# Patient Record
Sex: Female | Born: 1955 | Race: White | Hispanic: No | Marital: Married | Smoking: Former smoker
Health system: Southern US, Community
[De-identification: ages and names within clinical notes are randomized; demographics above are authoritative.]

## PROBLEM LIST (undated history)

## (undated) HISTORY — PX: BREAST ENHANCEMENT SURGERY: SHX7

## (undated) HISTORY — PX: TUBAL LIGATION: SHX77

## (undated) HISTORY — PX: OTHER SURGICAL HISTORY: SHX169

## (undated) HISTORY — PX: AUGMENTATION MAMMAPLASTY: SUR837

---

## 2016-10-15 ENCOUNTER — Ambulatory Visit: Payer: Self-pay | Admitting: Physician Assistant

## 2016-10-26 ENCOUNTER — Ambulatory Visit (INDEPENDENT_AMBULATORY_CARE_PROVIDER_SITE_OTHER): Payer: 59 | Admitting: Physician Assistant

## 2016-10-26 ENCOUNTER — Encounter: Payer: Self-pay | Admitting: Physician Assistant

## 2016-10-26 VITALS — BP 108/62 | HR 53 | Temp 97.7°F | Resp 16 | Ht 61.5 in | Wt 113.0 lb

## 2016-10-26 DIAGNOSIS — Z0000 Encounter for general adult medical examination without abnormal findings: Secondary | ICD-10-CM | POA: Diagnosis not present

## 2016-10-26 DIAGNOSIS — Z7689 Persons encountering health services in other specified circumstances: Secondary | ICD-10-CM

## 2016-10-26 NOTE — Progress Notes (Signed)
Patient ID: Jessica Duarte MRN: [id number], DOB: 12/04/1955, 61 y.o. Date of Encounter: 10/26/2016,   Chief Complaint: New Patient. Establish Care. Physical (CPE)  HPI: 61 y.o. y/o female here as a New Patient to Establish Care.   She moved here from [place], [place] in July 2017.  She went to her primary provider there for routine visits.  She had a complete physical exam there just before she moved.  Says that she had Pap smear and mammogram just before she moved here. States that she was had state insurance when she worked there but now has a Armed forces logistics/support/administrative officer. 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 Has a new insurance plan since moving here. Says that she wasn't 61 yet at the time of her visit there, so she did not get a shingles shot.  Otherwise, knows that all of her other preventive care is up-to-date and that the Shingles Vaccine is the only thing that would be indicated at this time. Says that she has had 2 colonoscopies. The first one revealed polyps so she had a repeat in 5 years. Says that the second one revealed no polyps. She has no family history of colon cancer.  Last colonoscopy was around October 2016.  Has no known chronic medical problems. She takes no medications.   Has no specific concerns to address today.  She is fine with [place] billing today's visit as a physical since this is a new insurance than the insurance she had at her last physical July 2017.  She knows that she just had breast exam, pelvic exam etc. so does not want to repeat these exams today.   Review of Systems: Consitutional: No fever, chills, fatigue, night sweats, lymphadenopathy. No significant/unexplained weight changes. Eyes: No visual changes, eye redness, or discharge. ENT/Mouth: No ear pain, sore throat, nasal drainage, or sinus pain. Cardiovascular: No chest pressure,heaviness, tightness or squeezing, even with exertion. No increased shortness of breath or dyspnea on  exertion.No palpitations, edema, orthopnea, PND. Respiratory: No cough, hemoptysis, SOB, or wheezing. Gastrointestinal: No anorexia, dysphagia, reflux, pain, nausea, vomiting, hematemesis, diarrhea, constipation, BRBPR, or melena. Breast: No mass, nodules, bulging, or retraction. No skin changes or inflammation. No nipple discharge. No lymphadenopathy. Genitourinary: No dysuria, hematuria, incontinence, vaginal discharge, pruritis, burning, abnormal bleeding, or pain. Musculoskeletal: No decreased ROM, No joint pain or swelling. No significant pain in neck, back, or extremities. Skin: No rash, pruritis, or concerning lesions. Neurological: No headache, dizziness, syncope, seizures, tremors, memory loss, coordination problems, or paresthesias. Psychological: No anxiety, depression, hallucinations, SI/HI. Endocrine: No polydipsia, polyphagia, polyuria, or known diabetes.No increased fatigue. No palpitations/rapid heart rate. No significant/unexplained weight change. All other systems were reviewed and are otherwise negative.  History reviewed. No pertinent past medical history.   Past Surgical History:  Procedure Laterality Date  . BREAST ENHANCEMENT SURGERY Bilateral    1990's  . TUBAL LIGATION    . vocal cord polyps      Home Meds:  No outpatient prescriptions prior to visit.   No facility-administered medications prior to visit.     Allergies: No Known Allergies  Social History   Social History  . Marital status: Married    Spouse name: N/A  . Number of children: N/A  . Years of education: N/A   Occupational History  . Not on file.   Social History Main Topics  . Smoking status: Former Smoker    Quit date: 10/27/2011  . Smokeless tobacco: Never Used  .  Alcohol use 0.6 oz/week    1 Glasses of wine per week  . Drug use: No  . Sexual activity: Yes   Other Topics Concern  . Not on file   Social History Narrative  . No narrative on file    Family History  Problem  Relation Age of Onset  . Arthritis Mother   . Hypertension Mother   . Kidney disease Mother   . Alcohol abuse Father   . Arthritis Father   . COPD Father   . Hearing loss Father   . Heart disease Father     Physical Exam: Blood pressure 108/62, pulse (!) 53, temperature 97.7 F (36.5 C), temperature source Oral, resp. rate 16, height 5' 1.5" (1.562 m), weight 113 lb (51.3 kg), SpO2 98 %., Body mass index is 21.01 kg/m. General: Well developed, well nourished WF. Appears in no acute distress. HEENT: Normocephalic, atraumatic. Conjunctiva pink, sclera non-icteric. Pupils 2 mm constricting to 1 mm, round, regular, and equally reactive to light and accomodation. EOMI. Internal auditory canal clear. TMs with good cone of light and without pathology. Nasal mucosa pink. Nares are without discharge. No sinus tenderness. Oral mucosa pink.  Pharynx without exudate.   Neck: Supple. Trachea midline. No thyromegaly. Full ROM. No lymphadenopathy.No Carotid Bruits. Lungs: Clear to auscultation bilaterally without wheezes, rales, or rhonchi. Breathing is of normal effort and unlabored. Cardiovascular: RRR with S1 S2. No murmurs, rubs, or gallops. Distal pulses 2+ symmetrically. No carotid or abdominal bruits. Breast: She defers. See HPI. Abdomen: Soft, non-tender, non-distended with normoactive bowel sounds. No hepatosplenomegaly or masses. No rebound/guarding. No CVA tenderness. No hernias.  Genitourinary: She defers. See HPI. Musculoskeletal: Full range of motion and 5/5 strength throughout.  Skin: Warm and moist without erythema, ecchymosis, wounds, or rash. Neuro: A+Ox3. CN II-XII grossly intact. Moves all extremities spontaneously. Full sensation throughout. Normal gait. Psych:  Responds to questions appropriately with a normal affect.   Assessment/Plan:  61 y.o. y/o female here for CPE  1. Encounter to establish care  2. Encounter for preventive health examination  A. Screening Labs: - CBC  with Differential/Platelet - COMPLETE METABOLIC PANEL WITH GFR - Lipid panel - TSH - VITAMIN D 25 Hydroxy (Vit-D Deficiency, Fractures)  B. Pap: She had Pap in [facility] July 2017--Reportedly negative  C. Screening Mammogram: Had mammogram in [place] in 2017. Discussed placing order for f/u mammogram but she wants to hold off. She will check her records regarding date of last mammogram. She will call when she needs [place] to place this order. Discussed that it is recommended to have mammogram annually. She voices understanding and agrees.  D. DEXA/BMD:  Can wait until age 80 to start these  E. Colorectal Cancer Screening: Says that she has had 2 colonoscopies. The first one revealed polyps so she had a repeat in 5 years. Says that the second one revealed no polyps. She has no family history of colon cancer.  Last colonoscopy was around October 2016. Discussed with her that if the last colonoscopy revealed no polyps and that she has no family history of colon cancer, she should be able to wait 10 years for next colonoscopy.  F. Immunizations:  Influenza: States that she never gets the flu vaccine Tetanus: States that she knows she had a tetanus vaccine within the past 5 years when she stepped on something. Does not know exact date. Pneumococcal: States that she has never had a pneumonia vaccine. She has no indication to need this until  age 55. Zostavax:  Initially she said that now that she is age 34, she wanted to get the shingles vaccine. I then discussed that we usually have patients check on cost with their insurance plan and she does want to check on this first. She is to call and get that information and then call and give [place] the information and decide whether she wants to proceed with this.  Signed, [address] [place], [place], [facility] 10/26/2016 10:13 AM

## 2016-10-27 LAB — LIPID PANEL
Cholesterol: 242 mg/dL — ABNORMAL HIGH (ref <200)
HDL: 85 mg/dL (ref >50)
LDL CALC: 140 mg/dL — AB (ref <100)
Total CHOL/HDL Ratio: 2.8 Ratio (ref <5.0)
Triglycerides: 84 mg/dL (ref <150)
VLDL: 17 mg/dL (ref <30)

## 2016-10-27 LAB — COMPLETE METABOLIC PANEL WITH GFR
ALT: 11 U/L (ref 6–29)
AST: 16 U/L (ref 10–35)
Albumin: 4.4 g/dL (ref 3.6–5.1)
Alkaline Phosphatase: 61 U/L (ref 33–130)
BUN: 14 mg/dL (ref 7–25)
CALCIUM: 9.9 mg/dL (ref 8.6–10.4)
CHLORIDE: 104 mmol/L (ref 98–110)
CO2: 24 mmol/L (ref 20–31)
Creat: 0.74 mg/dL (ref 0.50–0.99)
GFR, Est African American: >89 mL/min (ref >=60)
GFR, Est Non African American: 88 mL/min (ref >=60)
GLUCOSE: 91 mg/dL (ref 70–99)
Potassium: 4.9 mmol/L (ref 3.5–5.3)
SODIUM: 140 mmol/L (ref 135–146)
Total Bilirubin: 0.4 mg/dL (ref 0.2–1.2)
Total Protein: 7.1 g/dL (ref 6.1–8.1)

## 2016-10-27 LAB — CBC WITH DIFFERENTIAL/PLATELET
BASOS PCT: 1 %
Basophils Absolute: 53 cells/uL (ref 0–200)
Eosinophils Absolute: 106 cells/uL (ref 15–500)
Eosinophils Relative: 2 %
HEMATOCRIT: 42.5 % (ref 35.0–45.0)
Hemoglobin: 14.1 g/dL (ref 12.0–15.0)
LYMPHS ABS: 1908 {cells}/uL (ref 850–3900)
Lymphocytes Relative: 36 %
MCH: 32.3 pg (ref 27.0–33.0)
MCHC: 33.2 g/dL (ref 32.0–36.0)
MCV: 97.3 fL (ref 80.0–100.0)
MONO ABS: 583 {cells}/uL (ref 200–950)
MPV: 9.2 fL (ref 7.5–12.5)
Monocytes Relative: 11 %
Neutro Abs: 2650 cells/uL (ref 1500–7800)
Neutrophils Relative %: 50 %
Platelets: 286 10*3/uL (ref 140–400)
RBC: 4.37 MIL/uL (ref 3.80–5.10)
RDW: 13.1 % (ref 11.0–15.0)
WBC: 5.3 10*3/uL (ref 3.8–10.8)

## 2016-10-27 LAB — VITAMIN D 25 HYDROXY (VIT D DEFICIENCY, FRACTURES): VIT D 25 HYDROXY: 19 ng/mL — AB (ref 30–100)

## 2016-10-27 LAB — TSH: TSH: 0.97 m[IU]/L

## 2016-10-28 ENCOUNTER — Other Ambulatory Visit: Payer: Self-pay

## 2016-10-28 DIAGNOSIS — E559 Vitamin D deficiency, unspecified: Secondary | ICD-10-CM | POA: Insufficient documentation

## 2016-10-28 MED ORDER — VITAMIN D 50 MCG (2000 UT) PO TABS: 2000.0000 [IU] | ORAL_TABLET | Freq: Every day | ORAL | 3 refills | Status: AC

## 2017-04-27 ENCOUNTER — Other Ambulatory Visit: Payer: Self-pay | Admitting: Physician Assistant

## 2017-04-27 DIAGNOSIS — Z1231 Encounter for screening mammogram for malignant neoplasm of breast: Secondary | ICD-10-CM

## 2017-05-11 ENCOUNTER — Ambulatory Visit
Admission: RE | Admit: 2017-05-11 | Discharge: 2017-05-11 | Disposition: A | Discharge status: Home / Self Care | Payer: 59 | Source: Ambulatory Visit | Attending: Physician Assistant | Admitting: Physician Assistant

## 2017-05-11 DIAGNOSIS — Z1231 Encounter for screening mammogram for malignant neoplasm of breast: Secondary | ICD-10-CM

## 2017-05-18 ENCOUNTER — Ambulatory Visit (INDEPENDENT_AMBULATORY_CARE_PROVIDER_SITE_OTHER): Payer: 59 | Admitting: Family Medicine

## 2017-05-18 ENCOUNTER — Encounter: Payer: Self-pay | Admitting: Family Medicine

## 2017-05-18 VITALS — BP 144/68 | HR 68 | Temp 99.7°F | Wt 111.0 lb

## 2017-05-18 DIAGNOSIS — J329 Chronic sinusitis, unspecified: Secondary | ICD-10-CM

## 2017-05-18 DIAGNOSIS — G44209 Tension-type headache, unspecified, not intractable: Secondary | ICD-10-CM

## 2017-05-18 MED ORDER — DICLOFENAC SODIUM 75 MG PO TBEC: 75.0000 mg | DELAYED_RELEASE_TABLET | Freq: Two times a day (BID) | ORAL | 0 refills | Status: DC

## 2017-05-18 MED ORDER — AMOXICILLIN 875 MG PO TABS: 875.0000 mg | ORAL_TABLET | Freq: Two times a day (BID) | ORAL | 0 refills | Status: DC

## 2017-05-18 MED ORDER — TIZANIDINE HCL 4 MG PO TABS: 4.0000 mg | ORAL_TABLET | Freq: Four times a day (QID) | ORAL | 0 refills | Status: DC | PRN

## 2017-05-18 NOTE — Progress Notes (Signed)
Subjective:    Patient ID: Jessica Duarte, female    DOB: 1956-04-28, 61 y.o.   MRN: [id number]  HPI Patient presents with pain and pressure in her right maxillary sinus for 2-3 days.  She also reports postnasal drip and rhinorrhea.  Her husband has recently had the flu and a viral infection.  She denies any cough or body aches or fever however.  However her biggest concern is a headache that is.  The pain is located in her left occiput near the insertion of the trapezius.  It is made worse with range of motion in the neck.  It is tender to palpation near the insertion of the trapezius.  She denies any falls or injuries.  Pain is made worse with forward flexion of the neck.  However she shows no meningismus and is nontoxic.  She is tender to palpation on the occiput.  She denies any photophobia or phonophobia or neurologic deficit.  She denies any nausea or vomiting.  The headache is mild 3-4 on a scale of 1-10 but makes it uncomfortable for her to sleep at night No past medical history on file. Past Surgical History:  Procedure Laterality Date  . AUGMENTATION MAMMAPLASTY Bilateral   . BREAST ENHANCEMENT SURGERY Bilateral    1990's  . TUBAL LIGATION    . vocal cord polyps     Current Outpatient Prescriptions on File Prior to Visit  Medication Sig Dispense Refill  . Cholecalciferol (VITAMIN D) 2000 units tablet Take 1 tablet (2,000 Units total) by mouth daily. 30 tablet 3   No current facility-administered medications on file prior to visit.    No Known Allergies Social History   Social History  . Marital status: Married    Spouse name: N/A  . Number of children: N/A  . Years of education: N/A   Occupational History  . Not on file.   Social History Main Topics  . Smoking status: Former Smoker    Quit date: 10/27/2011  . Smokeless tobacco: Never Used  . Alcohol use 0.6 oz/week    1 Glasses of wine per week  . Drug use: No  . Sexual activity: Yes   Other Topics Concern  . Not on  file   Social History Narrative  . No narrative on file      Review of Systems  All other systems reviewed and are negative.      Objective:   Physical Exam  Constitutional: She is oriented to person, place, and time.  HENT:  Head: Normocephalic and atraumatic.    Right Ear: Tympanic membrane and ear canal normal.  Left Ear: Tympanic membrane and ear canal normal.  Nose: Mucosal edema and rhinorrhea present. Right sinus exhibits maxillary sinus tenderness.  Mouth/Throat: Oropharynx is clear and moist and mucous membranes are normal.  Neck: Neck supple. No JVD present.  Cardiovascular: Normal rate, regular rhythm and normal heart sounds.   Pulmonary/Chest: Effort normal and breath sounds normal. No respiratory distress. She has no wheezes. She has no rales.  Abdominal: Soft. Bowel sounds are normal. She exhibits no distension. There is no tenderness. There is no rebound.  Lymphadenopathy:    She has no cervical adenopathy.  Neurological: She is alert and oriented to person, place, and time. She has normal reflexes. She displays normal reflexes. No cranial nerve deficit. She exhibits normal muscle tone. Coordination normal.          Assessment & Plan:  Tension headache  Rhinosinusitis  I believe the  headache in her occiput is muscular and likely represents a strain in the trapezius muscle near its insertion on the occiput similar to a crick in the neck and I recommended Zanaflex 4 mg every 8 hours as needed for pain in the head/neck as well as diclofenac 75 mg p.o. twice daily as needed headache.  Reassess in 1 week or sooner if worse.  I would treat sinusitis with amoxicillin 875 mg p.o. twice daily for 10 days.

## 2018-05-16 ENCOUNTER — Other Ambulatory Visit: Payer: Self-pay | Admitting: Physician Assistant

## 2018-05-16 DIAGNOSIS — Z1231 Encounter for screening mammogram for malignant neoplasm of breast: Secondary | ICD-10-CM

## 2018-06-20 ENCOUNTER — Ambulatory Visit
Admission: RE | Admit: 2018-06-20 | Discharge: 2018-06-20 | Disposition: A | Discharge status: Home / Self Care | Payer: 59 | Source: Ambulatory Visit | Attending: Physician Assistant | Admitting: Physician Assistant

## 2018-06-20 DIAGNOSIS — Z1231 Encounter for screening mammogram for malignant neoplasm of breast: Secondary | ICD-10-CM

## 2018-06-29 ENCOUNTER — Ambulatory Visit (INDEPENDENT_AMBULATORY_CARE_PROVIDER_SITE_OTHER): Payer: 59 | Admitting: Family Medicine

## 2018-06-29 DIAGNOSIS — Z23 Encounter for immunization: Secondary | ICD-10-CM

## 2018-06-29 MED ORDER — ZOSTER VAC RECOMB ADJUVANTED 50 MCG/0.5ML IM SUSR: 0.5000 mL | Freq: Once | INTRAMUSCULAR | 1 refills | Status: AC

## 2019-08-28 ENCOUNTER — Ambulatory Visit (INDEPENDENT_AMBULATORY_CARE_PROVIDER_SITE_OTHER): Payer: 59 | Admitting: Family Medicine

## 2019-08-28 ENCOUNTER — Encounter: Payer: Self-pay | Admitting: Family Medicine

## 2019-08-28 ENCOUNTER — Other Ambulatory Visit: Payer: Self-pay

## 2019-08-28 VITALS — BP 118/74 | HR 50 | Temp 96.8°F | Resp 12 | Ht 61.5 in | Wt 106.0 lb

## 2019-08-28 DIAGNOSIS — Z0000 Encounter for general adult medical examination without abnormal findings: Secondary | ICD-10-CM | POA: Diagnosis not present

## 2019-08-28 DIAGNOSIS — Z23 Encounter for immunization: Secondary | ICD-10-CM

## 2019-08-28 DIAGNOSIS — E7800 Pure hypercholesterolemia, unspecified: Secondary | ICD-10-CM | POA: Diagnosis not present

## 2019-08-28 DIAGNOSIS — Z1231 Encounter for screening mammogram for malignant neoplasm of breast: Secondary | ICD-10-CM

## 2019-08-28 NOTE — Progress Notes (Signed)
Subjective:    Patient ID: Jessica Duarte, female    DOB: 1955-09-07, 64 y.o.   MRN: [id number]  HPI Patient is a very pleasant 64 year old white female who presents today for complete physical exam.  She has no concerns.  She has not had a Pap smear in over 4 years.  She is due for this today however she declines this.  She would like to reschedule this at her convenience.  She is overdue for mammogram.  She does request that I schedule this.  She had a bone density a few years ago that was normal.  She is due for this again when she turns 65.  She believes her last colonoscopy was performed in [place] in 2016.  She was told that this was completely normal.  Therefore she is not due for this again until 2026.  Her immunizations are up-to-date except for her flu shot and tetanus shot.  She request these today.  Otherwise she is doing well with no concerns. No past medical history on file.  Past Surgical History:  Procedure Laterality Date  . AUGMENTATION MAMMAPLASTY Bilateral   . BREAST ENHANCEMENT SURGERY Bilateral    1990's  . TUBAL LIGATION    . vocal cord polyps     Current Outpatient Medications on File Prior to Visit  Medication Sig Dispense Refill  . Cholecalciferol (VITAMIN D) 2000 units tablet Take 1 tablet (2,000 Units total) by mouth daily. 30 tablet 3   No current facility-administered medications on file prior to visit.   No Known Allergies Social History   Socioeconomic History  . Marital status: Married    Spouse name: Not on file  . Number of children: Not on file  . Years of education: Not on file  . Highest education level: Not on file  Occupational History  . Not on file  Tobacco Use  . Smoking status: Former Smoker    Quit date: 10/27/2011    Years since quitting: 7.8  . Smokeless tobacco: Never Used  Substance and Sexual Activity  . Alcohol use: Yes    Alcohol/week: 1.0 standard drinks    Types: 1 Glasses of wine per week  . Drug use: No  . Sexual activity:  Yes  Other Topics Concern  . Not on file  Social History Narrative  . Not on file   Social Determinants of Health   Financial Resource Strain:   . Difficulty of Paying Living Expenses: Not on file  Food Insecurity:   . Worried About Programme researcher, broadcasting/film/video in the Last Year: Not on file  . Ran Out of Food in the Last Year: Not on file  Transportation Needs:   . Lack of Transportation (Medical): Not on file  . Lack of Transportation (Non-Medical): Not on file  Physical Activity:   . Days of Exercise per Week: Not on file  . Minutes of Exercise per Session: Not on file  Stress:   . Feeling of Stress : Not on file  Social Connections:   . Frequency of Communication with Friends and Family: Not on file  . Frequency of Social Gatherings with Friends and Family: Not on file  . Attends Religious Services: Not on file  . Active Member of Clubs or Organizations: Not on file  . Attends Banker Meetings: Not on file  . Marital Status: Not on file  Intimate Partner Violence:   . Fear of Current or Ex-Partner: Not on file  . Emotionally Abused: Not  on file  . Physically Abused: Not on file  . Sexually Abused: Not on file   Family History  Problem Relation Age of Onset  . Arthritis Mother   . Hypertension Mother   . Kidney disease Mother   . Alcohol abuse Father   . Arthritis Father   . COPD Father   . Hearing loss Father   . Heart disease Father   . Breast cancer Neg Hx       Review of Systems  All other systems reviewed and are negative.      Objective:   Physical Exam Vitals reviewed.  Constitutional:      General: She is not in acute distress.    Appearance: Normal appearance. She is normal weight. She is not ill-appearing, toxic-appearing or diaphoretic.  HENT:     Head: Normocephalic and atraumatic.     Right Ear: Tympanic membrane, ear canal and external ear normal. There is no impacted cerumen.     Left Ear: Tympanic membrane, ear canal and external  ear normal. There is no impacted cerumen.     Nose: No congestion or rhinorrhea.     Mouth/Throat:     Mouth: Mucous membranes are moist.     Pharynx: No oropharyngeal exudate or posterior oropharyngeal erythema.  Eyes:     General: No scleral icterus.       Right eye: No discharge.        Left eye: No discharge.     Extraocular Movements: Extraocular movements intact.     Conjunctiva/sclera: Conjunctivae normal.     Pupils: Pupils are equal, round, and reactive to light.  Neck:     Vascular: No carotid bruit.  Cardiovascular:     Rate and Rhythm: Normal rate and regular rhythm.     Pulses: Normal pulses.     Heart sounds: Normal heart sounds. No murmur. No friction rub. No gallop.   Pulmonary:     Effort: Pulmonary effort is normal. No respiratory distress.     Breath sounds: Normal breath sounds. No stridor. No wheezing, rhonchi or rales.  Chest:     Chest wall: No tenderness.  Abdominal:     General: Abdomen is flat. Bowel sounds are normal. There is no distension.     Palpations: Abdomen is soft. There is no mass.     Tenderness: There is no abdominal tenderness. There is no right CVA tenderness, left CVA tenderness, guarding or rebound.     Hernia: No hernia is present.  Musculoskeletal:     Cervical back: Normal range of motion and neck supple. No rigidity or tenderness.     Right lower leg: No edema.     Left lower leg: No edema.  Lymphadenopathy:     Cervical: No cervical adenopathy.  Skin:    General: Skin is warm.     Coloration: Skin is not jaundiced or pale.     Findings: No bruising, erythema, lesion or rash.  Neurological:     General: No focal deficit present.     Mental Status: She is alert and oriented to person, place, and time. Mental status is at baseline.     Cranial Nerves: No cranial nerve deficit.     Sensory: No sensory deficit.     Motor: No weakness.     Coordination: Coordination normal.     Gait: Gait normal.     Deep Tendon Reflexes:  Reflexes normal.  Psychiatric:        Mood and Affect:  Mood normal.        Behavior: Behavior normal.        Thought Content: Thought content normal.        Judgment: Judgment normal.           Assessment & Plan:  Encounter for screening mammogram for malignant neoplasm of breast - Plan: MM Digital Screening  Pure hypercholesterolemia - Plan: CBC with Differential/Platelet, COMPLETE METABOLIC PANEL WITH GFR, Lipid panel  General medical exam   We discussed screening for hepatitis C and HIV.  The patient politely declines this.  I will schedule the patient for a mammogram.  She defers her Pap smear today and will reschedule this in her future.  I would recommend a bone density test at age 89.  Colonoscopy is not due again until 2026.  Patient will return for a CBC, CMP, fasting lipid panel.  Blood pressure is excellent.  Recommended she take 1200 mg a day of calcium and 1000 units a day of vitamin D.  Patient received a flu shot as well as a tetanus shot.

## 2019-08-28 NOTE — Addendum Note (Signed)
Addended by: Legrand Rams B on: 08/28/2019 12:16 PM   Modules accepted: Orders

## 2019-08-29 ENCOUNTER — Other Ambulatory Visit: Payer: 59

## 2019-08-30 LAB — LIPID PANEL
Cholesterol: 234 mg/dL — ABNORMAL HIGH (ref <200)
HDL: 84 mg/dL (ref >=50)
LDL Cholesterol (Calc): 129 mg/dL (calc) — ABNORMAL HIGH
Non-HDL Cholesterol (Calc): 150 mg/dL (calc) — ABNORMAL HIGH (ref <130)
Total CHOL/HDL Ratio: 2.8 (calc) (ref <5.0)
Triglycerides: 99 mg/dL (ref <150)

## 2019-08-30 LAB — CBC WITH DIFFERENTIAL/PLATELET
Absolute Monocytes: 420 cells/uL (ref 200–950)
Basophils Absolute: 38 cells/uL (ref 0–200)
Basophils Relative: 0.9 %
Eosinophils Absolute: 59 cells/uL (ref 15–500)
Eosinophils Relative: 1.4 %
HCT: 41.4 % (ref 35.0–45.0)
Hemoglobin: 14.1 g/dL (ref 11.7–15.5)
Lymphs Abs: 966 cells/uL (ref 850–3900)
MCH: 32.6 pg (ref 27.0–33.0)
MCHC: 34.1 g/dL (ref 32.0–36.0)
MCV: 95.6 fL (ref 80.0–100.0)
MPV: 9.4 fL (ref 7.5–12.5)
Monocytes Relative: 10 %
Neutro Abs: 2717 cells/uL (ref 1500–7800)
Neutrophils Relative %: 64.7 %
Platelets: 262 10*3/uL (ref 140–400)
RBC: 4.33 10*6/uL (ref 3.80–5.10)
RDW: 11.7 % (ref 11.0–15.0)
Total Lymphocyte: 23 %
WBC: 4.2 10*3/uL (ref 3.8–10.8)

## 2019-08-30 LAB — COMPLETE METABOLIC PANEL WITH GFR
AG Ratio: 2 (calc) (ref 1.0–2.5)
ALT: 15 U/L (ref 6–29)
AST: 19 U/L (ref 10–35)
Albumin: 4.4 g/dL (ref 3.6–5.1)
Alkaline phosphatase (APISO): 66 U/L (ref 37–153)
BUN: 17 mg/dL (ref 7–25)
CO2: 28 mmol/L (ref 20–32)
Calcium: 10 mg/dL (ref 8.6–10.4)
Chloride: 102 mmol/L (ref 98–110)
Creat: 0.81 mg/dL (ref 0.50–0.99)
GFR, Est African American: 90 mL/min/{1.73_m2} (ref >=60)
GFR, Est Non African American: 77 mL/min/{1.73_m2} (ref >=60)
Globulin: 2.2 g/dL (calc) (ref 1.9–3.7)
Glucose, Bld: 83 mg/dL (ref 65–99)
Potassium: 4.2 mmol/L (ref 3.5–5.3)
Sodium: 140 mmol/L (ref 135–146)
Total Bilirubin: 0.6 mg/dL (ref 0.2–1.2)
Total Protein: 6.6 g/dL (ref 6.1–8.1)

## 2019-10-09 ENCOUNTER — Ambulatory Visit
Admission: RE | Admit: 2019-10-09 | Discharge: 2019-10-09 | Disposition: A | Discharge status: Home / Self Care | Payer: 59 | Source: Ambulatory Visit | Attending: Family Medicine | Admitting: Family Medicine

## 2019-10-09 ENCOUNTER — Other Ambulatory Visit: Payer: Self-pay

## 2019-10-09 DIAGNOSIS — Z1231 Encounter for screening mammogram for malignant neoplasm of breast: Secondary | ICD-10-CM

## 2021-02-04 ENCOUNTER — Other Ambulatory Visit: Payer: Self-pay

## 2021-02-04 ENCOUNTER — Encounter: Payer: Self-pay | Admitting: Family Medicine

## 2021-02-04 ENCOUNTER — Ambulatory Visit (INDEPENDENT_AMBULATORY_CARE_PROVIDER_SITE_OTHER): Payer: Medicare Other | Admitting: Family Medicine

## 2021-02-04 VITALS — BP 122/74 | HR 67 | Temp 97.9°F | Ht 61.5 in | Wt 105.8 lb

## 2021-02-04 DIAGNOSIS — Z0001 Encounter for general adult medical examination with abnormal findings: Secondary | ICD-10-CM

## 2021-02-04 DIAGNOSIS — Z23 Encounter for immunization: Secondary | ICD-10-CM

## 2021-02-04 DIAGNOSIS — Z13220 Encounter for screening for lipoid disorders: Secondary | ICD-10-CM

## 2021-02-04 DIAGNOSIS — Z780 Asymptomatic menopausal state: Secondary | ICD-10-CM

## 2021-02-04 DIAGNOSIS — R5383 Other fatigue: Secondary | ICD-10-CM

## 2021-02-04 DIAGNOSIS — Z1231 Encounter for screening mammogram for malignant neoplasm of breast: Secondary | ICD-10-CM | POA: Diagnosis not present

## 2021-02-04 DIAGNOSIS — Z0000 Encounter for general adult medical examination without abnormal findings: Secondary | ICD-10-CM

## 2021-02-04 NOTE — Progress Notes (Signed)
Subjective:    Patient ID: Jessica Duarte, female    DOB: 11/06/1955, 65 y.o.   MRN: [id number]  HPI Patient is a 65 year old Caucasian female here today for complete physical exam.  Patient is due for a Pap smear but she declines this.  She reluctantly agrees to a mammogram as well as a bone density test.  Her colonoscopy is up-to-date.  Her last colonoscopy was in 6 years ago in [place].  She is not due for another colonoscopy for 4 more years.  She is due for Pneumovax 23.  She is also due for shingles vaccine. History reviewed. No pertinent past medical history.  Past Surgical History:  Procedure Laterality Date   AUGMENTATION MAMMAPLASTY Bilateral    BREAST ENHANCEMENT SURGERY Bilateral    1990's   TUBAL LIGATION     vocal cord polyps     Current Outpatient Medications on File Prior to Visit  Medication Sig Dispense Refill   Cholecalciferol (VITAMIN D) 2000 units tablet Take 1 tablet (2,000 Units total) by mouth daily. (Patient not taking: Reported on 02/04/2021) 30 tablet 3   No current facility-administered medications on file prior to visit.   No Known Allergies Social History   Socioeconomic History   Marital status: Married    Spouse name: Not on file   Number of children: Not on file   Years of education: Not on file   Highest education level: Not on file  Occupational History   Not on file  Tobacco Use   Smoking status: Former    Pack years: 0.00    Types: Cigarettes    Quit date: 10/27/2011    Years since quitting: 9.2   Smokeless tobacco: Never  Substance and Sexual Activity   Alcohol use: Yes    Alcohol/week: 1.0 standard drink    Types: 1 Glasses of wine per week   Drug use: No   Sexual activity: Yes  Other Topics Concern   Not on file  Social History Narrative   Not on file   Social Determinants of Health   Financial Resource Strain: Not on file  Food Insecurity: Not on file  Transportation Needs: Not on file  Physical Activity: Not on file   Stress: Not on file  Social Connections: Not on file  Intimate Partner Violence: Not on file   Family History  Problem Relation Age of Onset   Arthritis Mother    Hypertension Mother    Kidney disease Mother    Alcohol abuse Father    Arthritis Father    COPD Father    Hearing loss Father    Heart disease Father    Breast cancer Neg Hx       Review of Systems  All other systems reviewed and are negative.     Objective:   Physical Exam Vitals reviewed.  Constitutional:      General: She is not in acute distress.    Appearance: Normal appearance. She is normal weight. She is not ill-appearing, toxic-appearing or diaphoretic.  HENT:     Head: Normocephalic and atraumatic.     Right Ear: Tympanic membrane, ear canal and external ear normal. There is no impacted cerumen.     Left Ear: Tympanic membrane, ear canal and external ear normal. There is no impacted cerumen.     Nose: No congestion or rhinorrhea.     Mouth/Throat:     Mouth: Mucous membranes are moist.     Pharynx: No oropharyngeal exudate or  posterior oropharyngeal erythema.  Eyes:     General: No scleral icterus.       Right eye: No discharge.        Left eye: No discharge.     Extraocular Movements: Extraocular movements intact.     Conjunctiva/sclera: Conjunctivae normal.     Pupils: Pupils are equal, round, and reactive to light.  Neck:     Vascular: No carotid bruit.  Cardiovascular:     Rate and Rhythm: Normal rate and regular rhythm.     Pulses: Normal pulses.     Heart sounds: Normal heart sounds. No murmur heard.   No friction rub. No gallop.  Pulmonary:     Effort: Pulmonary effort is normal. No respiratory distress.     Breath sounds: Normal breath sounds. No stridor. No wheezing, rhonchi or rales.  Chest:     Chest wall: No tenderness.  Abdominal:     General: Abdomen is flat. Bowel sounds are normal. There is no distension.     Palpations: Abdomen is soft. There is no mass.      Tenderness: There is no abdominal tenderness. There is no right CVA tenderness, left CVA tenderness, guarding or rebound.     Hernia: No hernia is present.  Musculoskeletal:     Cervical back: Normal range of motion and neck supple. No rigidity or tenderness.     Right lower leg: No edema.     Left lower leg: No edema.  Lymphadenopathy:     Cervical: No cervical adenopathy.  Skin:    General: Skin is warm.     Coloration: Skin is not jaundiced or pale.     Findings: No bruising, erythema, lesion or rash.  Neurological:     General: No focal deficit present.     Mental Status: She is alert and oriented to person, place, and time. Mental status is at baseline.     Cranial Nerves: No cranial nerve deficit.     Sensory: No sensory deficit.     Motor: No weakness.     Coordination: Coordination normal.     Gait: Gait normal.     Deep Tendon Reflexes: Reflexes normal.  Psychiatric:        Mood and Affect: Mood normal.        Behavior: Behavior normal.        Thought Content: Thought content normal.        Judgment: Judgment normal.          Assessment & Plan:  Postmenopausal estrogen deficiency - Plan: DG Bone Density  Need for pneumococcal vaccination - Plan: Pneumococcal polysaccharide vaccine 23-valent greater than or equal to 2yo subcutaneous/IM  Encounter for screening mammogram for malignant neoplasm of breast - Plan: MM Digital Screening  General medical exam - Plan: MM Digital Screening Return fasting for a CBC CMP and a fasting lipid panel.  Schedule patient for a bone density.  Schedule the patient for mammogram.  She received Pneumovax 23 today.  Recommended the shingles vaccine.  Patient declines a Pap smear.  Colonoscopy is up-to-date.

## 2021-02-05 ENCOUNTER — Encounter: Payer: Self-pay | Admitting: Family Medicine

## 2021-02-06 ENCOUNTER — Other Ambulatory Visit: Payer: Self-pay | Admitting: Family Medicine

## 2021-02-06 DIAGNOSIS — Z87891 Personal history of nicotine dependence: Secondary | ICD-10-CM

## 2021-02-06 DIAGNOSIS — Z122 Encounter for screening for malignant neoplasm of respiratory organs: Secondary | ICD-10-CM

## 2021-02-07 ENCOUNTER — Other Ambulatory Visit: Payer: Medicare Other

## 2021-02-07 ENCOUNTER — Other Ambulatory Visit: Payer: Self-pay

## 2021-02-07 LAB — COMPLETE METABOLIC PANEL WITH GFR
AG Ratio: 2 (calc) (ref 1.0–2.5)
ALT: 11 U/L (ref 6–29)
AST: 16 U/L (ref 10–35)
Albumin: 4.2 g/dL (ref 3.6–5.1)
Alkaline phosphatase (APISO): 62 U/L (ref 37–153)
BUN: 13 mg/dL (ref 7–25)
CO2: 30 mmol/L (ref 20–32)
Calcium: 10 mg/dL (ref 8.6–10.4)
Chloride: 103 mmol/L (ref 98–110)
Creat: 0.82 mg/dL (ref 0.50–1.05)
Globulin: 2.1 g/dL (calc) (ref 1.9–3.7)
Glucose, Bld: 81 mg/dL (ref 65–99)
Potassium: 4.9 mmol/L (ref 3.5–5.3)
Sodium: 141 mmol/L (ref 135–146)
Total Bilirubin: 0.4 mg/dL (ref 0.2–1.2)
Total Protein: 6.3 g/dL (ref 6.1–8.1)
eGFR: 79 mL/min/{1.73_m2} (ref >=60)

## 2021-02-07 LAB — CBC WITH DIFFERENTIAL/PLATELET
Absolute Monocytes: 632 cells/uL (ref 200–950)
Basophils Absolute: 88 cells/uL (ref 0–200)
Basophils Relative: 1.8 %
Eosinophils Absolute: 152 cells/uL (ref 15–500)
Eosinophils Relative: 3.1 %
HCT: 42.5 % (ref 35.0–45.0)
Hemoglobin: 14.2 g/dL (ref 11.7–15.5)
Lymphs Abs: 1759 cells/uL (ref 850–3900)
MCH: 31.8 pg (ref 27.0–33.0)
MCHC: 33.4 g/dL (ref 32.0–36.0)
MCV: 95.1 fL (ref 80.0–100.0)
MPV: 9.3 fL (ref 7.5–12.5)
Monocytes Relative: 12.9 %
Neutro Abs: 2269 cells/uL (ref 1500–7800)
Neutrophils Relative %: 46.3 %
Platelets: 334 10*3/uL (ref 140–400)
RBC: 4.47 10*6/uL (ref 3.80–5.10)
RDW: 11.8 % (ref 11.0–15.0)
Total Lymphocyte: 35.9 %
WBC: 4.9 10*3/uL (ref 3.8–10.8)

## 2021-02-07 LAB — LIPID PANEL
Cholesterol: 216 mg/dL — ABNORMAL HIGH (ref <200)
HDL: 70 mg/dL (ref >=50)
LDL Cholesterol (Calc): 125 mg/dL (calc) — ABNORMAL HIGH
Non-HDL Cholesterol (Calc): 146 mg/dL (calc) — ABNORMAL HIGH (ref <130)
Total CHOL/HDL Ratio: 3.1 (calc) (ref <5.0)
Triglycerides: 107 mg/dL (ref <150)

## 2021-02-17 ENCOUNTER — Other Ambulatory Visit: Payer: Self-pay | Admitting: *Deleted

## 2021-02-17 DIAGNOSIS — Z87891 Personal history of nicotine dependence: Secondary | ICD-10-CM

## 2021-03-17 ENCOUNTER — Ambulatory Visit
Admission: RE | Admit: 2021-03-17 | Discharge: 2021-03-17 | Disposition: A | Discharge status: Home / Self Care | Payer: Medicare Other | Source: Ambulatory Visit | Attending: Acute Care | Admitting: Acute Care

## 2021-03-17 ENCOUNTER — Encounter: Payer: Self-pay | Admitting: Acute Care

## 2021-03-17 ENCOUNTER — Ambulatory Visit (INDEPENDENT_AMBULATORY_CARE_PROVIDER_SITE_OTHER): Payer: Medicare Other | Admitting: Acute Care

## 2021-03-17 DIAGNOSIS — Z87891 Personal history of nicotine dependence: Secondary | ICD-10-CM

## 2021-03-17 DIAGNOSIS — Z122 Encounter for screening for malignant neoplasm of respiratory organs: Secondary | ICD-10-CM

## 2021-03-17 NOTE — Patient Instructions (Signed)
Thank you for participating in the [place] [facility]. It was our pleasure to meet you today. We will call you with the results of your scan within the next few days. Your scan will be assigned a Lung RADS category score by the physicians reading the scans.  This Lung RADS score determines follow up scanning.  See below for description of categories, and follow up screening recommendations. We will be in touch to schedule your follow up screening annually or based on recommendations of our providers. We will fax a copy of your scan results to your Primary Care Physician, or the physician who referred you to the program, to ensure they have the results. Please call the office if you have any questions or concerns regarding your scanning experience or results.  Our office number is [phone]. Please speak with Denise Phelps, RN. She is our Lung Cancer Screening RN. If she is unavailable when you call, please have the office staff send her a message. She will return your call at her earliest convenience. Remember, if your scan is normal, we will scan you annually as long as you continue to meet the criteria for the program. (Age 55-77, Current smoker or smoker who has quit within the last 15 years). If you are a smoker, remember, quitting is the single most powerful action that you can take to decrease your risk of lung cancer and other pulmonary, breathing related problems. We know quitting is hard, and we are here to help.  Please let us know if there is anything we can do to help you meet your goal of quitting. If you are a former smoker, congratulations. We are proud of you! Remain smoke free! Remember you can refer friends or family members through the number above.  We will screen them to make sure they meet criteria for the program. Thank you for helping us take better care of you by participating in Lung Screening.  Lung RADS Categories:  Lung RADS 1: no nodules  or definitely non-concerning nodules.  Recommendation is for a repeat annual scan in 12 months.  Lung RADS 2:  nodules that are non-concerning in appearance and behavior with a very low likelihood of becoming an active cancer. Recommendation is for a repeat annual scan in 12 months.  Lung RADS 3: nodules that are probably non-concerning , includes nodules with a low likelihood of becoming an active cancer.  Recommendation is for a 6-month repeat screening scan. Often noted after an upper respiratory illness. We will be in touch to make sure you have no questions, and to schedule your 6-month scan.  Lung RADS 4 A: nodules with concerning findings, recommendation is most often for a follow up scan in 3 months or additional testing based on our provider's assessment of the scan. We will be in touch to make sure you have no questions and to schedule the recommended 3 month follow up scan.  Lung RADS 4 B:  indicates findings that are concerning. We will be in touch with you to schedule additional diagnostic testing based on our provider's  assessment of the scan.   

## 2021-03-17 NOTE — Progress Notes (Signed)
Virtual Visit via Video Note  I connected with Jessica Duarte on 03/17/21 at  9:30 AM EDT by a video enabled telemedicine application and verified that I am speaking with the correct person using two identifiers.  Location: Patient: At home Provider: [address] [place], [place], [address]    I discussed the limitations of evaluation and management by telemedicine and the availability of in person appointments. The patient expressed understanding and agreed to proceed.  Shared Decision Making Visit Lung Cancer Screening Program [phone])   Eligibility: Age 65 y.o. Pack Years Smoking History Calculation 51 pack year smoking history (# packs/per year x # years smoked) Recent History of coughing up blood  no Unexplained weight loss? no ( >Than 15 pounds within the last 6 months ) Prior History Lung / other cancer no (Diagnosis within the last 5 years already requiring surveillance chest CT Scans). Smoking Status Former Smoker Former Smokers: Years since quit: 10 years  Quit Date: 2012  Visit Components: Discussion included one or more decision making aids. yes Discussion included risk/benefits of screening. yes Discussion included potential follow up diagnostic testing for abnormal scans. yes Discussion included meaning and risk of over diagnosis. yes Discussion included meaning and risk of False Positives. yes Discussion included meaning of total radiation exposure. yes  Counseling Included: Importance of adherence to annual lung cancer LDCT screening. yes Impact of comorbidities on ability to participate in the program. yes Ability and willingness to under diagnostic treatment. yes  Smoking Cessation Counseling: Current Smokers:  Discussed importance of smoking cessation. yes Information about tobacco cessation classes and interventions provided to patient. yes Patient provided with "ticket" for LDCT Scan. yes Symptomatic Patient. no  Counseling Diagnosis Code:  Tobacco Use Z72.0 Asymptomatic Patient yes  Counseling (Intermediate counseling: > three minutes counseling) [id number] Former Smokers:  Discussed the importance of maintaining cigarette abstinence. yes Diagnosis Code: Personal History of Nicotine Dependence. W29.562 Information about tobacco cessation classes and interventions provided to patient. Yes Patient provided with "ticket" for LDCT Scan. yes Written Order for Lung Cancer Screening with LDCT placed in Epic. Yes (CT Chest Lung Cancer Screening Low Dose W/O CM) [id number] Z12.2-Screening of respiratory organs Z87.891-Personal history of nicotine dependence  I spent 25 minutes of face to face time with Jessica Duarte discussing the risks and benefits of lung cancer screening. We viewed a power point together that explained in detail the above noted topics. We took the time to pause the power point at intervals to allow for questions to be asked and answered to ensure understanding. We discussed that she had taken the single most powerful action possible to decrease her risk of developing lung cancer when she quit smoking. I counseled her to remain smoke free, and to contact me if she ever had the desire to smoke again so that I can provide resources and tools to help support the effort to remain smoke free. We discussed the time and location of the scan, and that either  Jessica Miyamoto RN or I will call with the results within  24-48 hours of receiving them. She has my card and contact information in the event she needs to speak with me, in addition to a copy of the power point we reviewed as a resource. She verbalized understanding of all of the above and had no further questions upon leaving the office.     I explained to the patient that there has been a high incidence of coronary artery disease noted on these  exams. I explained that this is a non-gated exam therefore degree or severity cannot be determined. This patient is not on statin therapy. I have  asked the patient to follow-up with their PCP regarding any incidental finding of coronary artery disease and management with diet or medication as they feel is clinically indicated. The patient verbalized understanding of the above and had no further questions.  Pt does still on occasion use nicotine gum or mints. We will send her a stronger than your excuses card for free nicotine replacement therapy.      Bevelyn Ngo, NP 03/17/2021

## 2021-03-18 ENCOUNTER — Encounter: Payer: Self-pay | Admitting: Family Medicine

## 2021-03-18 ENCOUNTER — Telehealth: Payer: Self-pay | Admitting: Acute Care

## 2021-03-18 NOTE — Telephone Encounter (Signed)
Bevelyn Ngo, NP  Christen Butter, CMA; Abigail Miyamoto D, RN; [facility] Triage Pool 7 minutes ago (11:10 AM)   This will be called through the screening program. Thanks so much    Noted. Will close encounter.

## 2021-03-18 NOTE — Telephone Encounter (Signed)
Calling regarding CT results

## 2021-03-18 NOTE — Telephone Encounter (Signed)
Call report on LDCT ordered through screening program 03/17/21  Impression:  IMPRESSION: 1. Lung-RADS 4B, suspicious. Additional imaging evaluation or consultation with Pulmonology or Thoracic Surgery recommended. 2. 18 mm irregular infrahilar left lower lobe pulmonary nodule. This may well be infectious/inflammatory, but neoplasm could certainly have this appearance. No associated pleural effusion. 3.  Emphysema (ICD10-J43.9) and Aortic Atherosclerosis (ICD10-170.0)   These results will be called to the ordering clinician or representative by the Radiologist Assistant, and communication documented in the PACS or [employer].     Electronically Signed   By: Kennith Center M.D.   On: 03/18/2021 10:14

## 2021-03-18 NOTE — Progress Notes (Signed)
I have attempted to call the patient with the results of her low dose CT. There was no answer. I have left a HIPPA compliant message with the office contact information requesting that the patient call the office for the results of her low dose CT. Angelique Blonder, let me know if she calls for the results of her scan. Thanks so much

## 2021-03-18 NOTE — Telephone Encounter (Signed)
Jessica Duarte will be in office on 03/19/21 to follow up on this scan for the lung cancer screening patient.  Jessica Duarte

## 2021-03-18 NOTE — Telephone Encounter (Signed)
Spoke with patient and her daughter. Daughter states she is a physician and usually when the radiologist does a call report it usually means the results are suspicious. I did see your message earlier about results being called through the screening program. The daughter states that the Mychart message stated that the results were called in at 1014 this morning. Patient and the daughter would like to know the results. I sent a message to [place] as well, not sure if she is in office today.  Please advise

## 2021-03-19 ENCOUNTER — Other Ambulatory Visit: Payer: Self-pay | Admitting: Acute Care

## 2021-03-19 DIAGNOSIS — R911 Solitary pulmonary nodule: Secondary | ICD-10-CM

## 2021-03-19 NOTE — Progress Notes (Signed)
I have called the patient with the results of her low dose CT Scan. I explained to both the patient and her daughter that her scan was read as a Lung RADS 4 B indicates suspicious findings for which additional diagnostic testing and or tissue sampling is recommended. I explained that I haver reviewed the scan with Dr. Tonia Brooms, one of the lung doctor in the office. I have explained that the next step is to get a PET scan and PFT's to better evaluate the area of concern. She is in agreement with this plan. I have placed the order for the PET scan and PFT's . We will call the patient with the results of the PET scan once we have them,. And she will then meet with Dr. Tonia Brooms to determine best plan of care.   There was also a notation of a 6.4 cm homogeneous low-density lesion in the upper pole left kidney approaches water attenuation, compatible with a cyst.The daughter would like this worked up as her mom is a smoker and has an increased risk on renal cancer. . I am going to call Dr. Tanya Nones, the patient's PCP , to make him aware the patient's daughter would like UA and dedicated imaging of the kidney as follow up to this finding.    I have left a message on Dr. Caren Macadam office VM. I have left both my office contact number and cell number if they need any further explanation.

## 2021-03-20 ENCOUNTER — Encounter: Payer: Self-pay | Admitting: Family Medicine

## 2021-03-20 ENCOUNTER — Other Ambulatory Visit: Payer: Self-pay | Admitting: Family Medicine

## 2021-03-20 ENCOUNTER — Telehealth: Payer: Self-pay | Admitting: Family Medicine

## 2021-03-20 DIAGNOSIS — N281 Cyst of kidney, acquired: Secondary | ICD-10-CM

## 2021-03-20 NOTE — Telephone Encounter (Signed)
Received voicemail message from Wayne Sever; wants to speak with nurse or provider about results of lung cancer screening. Please advise at [phone], or on cell at [phone].

## 2021-03-20 NOTE — Telephone Encounter (Signed)
PCP to be made aware.  

## 2021-03-20 NOTE — Telephone Encounter (Signed)
PCC's, please see pt email and let us know or email her back once PET approved. Thanks so much!  Hi Ms Alexandria Lodge   I was scheduled for the PET scan on 9/8.  My daughter called to see if there was a sooner appointment but was told that there needed to be a prior authorization by the provider from the the insurance.  My primary insurance is the medicare.  My daughter got for me the provider number for the clinic to call to request the prior authorization as well as the CPT and ICD 10 codes.  I was told the scan could be done on 9/1 if we could get the prior authorization approved. I would appreciate any assistance from  you or your clinic to help in this matter.   Medicare provider line [phone] Icd 10 R91.1 CPT [phone] the fax number at [facility] to send prior auth approval is [phone] [employer]   Again any assistance is greatly appreciated  Thank You Georg Ruddle

## 2021-03-24 ENCOUNTER — Ambulatory Visit
Admission: RE | Admit: 2021-03-24 | Discharge: 2021-03-24 | Disposition: A | Discharge status: Home / Self Care | Payer: Medicare Other | Source: Ambulatory Visit | Attending: Family Medicine | Admitting: Family Medicine

## 2021-03-24 DIAGNOSIS — N281 Cyst of kidney, acquired: Secondary | ICD-10-CM

## 2021-03-25 ENCOUNTER — Ambulatory Visit (INDEPENDENT_AMBULATORY_CARE_PROVIDER_SITE_OTHER): Payer: Medicare Other | Admitting: Internal Medicine

## 2021-03-25 ENCOUNTER — Other Ambulatory Visit: Payer: Self-pay

## 2021-03-25 DIAGNOSIS — R911 Solitary pulmonary nodule: Secondary | ICD-10-CM | POA: Diagnosis not present

## 2021-03-25 LAB — PULMONARY FUNCTION TEST
DL/VA % pred: 63 %
DL/VA: 2.69 ml/min/mmHg/L
DLCO cor % pred: 62 %
DLCO cor: 11.59 ml/min/mmHg
DLCO unc % pred: 62 %
DLCO unc: 11.59 ml/min/mmHg
FEF 25-75 Post: 1.21 L/sec
FEF 25-75 Pre: 1.08 L/sec
FEF2575-%Change-Post: 12 %
FEF2575-%Pred-Post: 60 %
FEF2575-%Pred-Pre: 53 %
FEV1-%Change-Post: 2 %
FEV1-%Pred-Post: 92 %
FEV1-%Pred-Pre: 90 %
FEV1-Post: 2.05 L
FEV1-Pre: 2.01 L
FEV1FVC-%Change-Post: 4 %
FEV1FVC-%Pred-Pre: 86 %
FEV6-%Change-Post: 0 %
FEV6-%Pred-Post: 104 %
FEV6-%Pred-Pre: 104 %
FEV6-Post: 2.93 L
FEV6-Pre: 2.94 L
FEV6FVC-%Change-Post: 2 %
FEV6FVC-%Pred-Post: 104 %
FEV6FVC-%Pred-Pre: 101 %
FVC-%Change-Post: -1 %
FVC-%Pred-Post: 101 %
FVC-%Pred-Pre: 103 %
FVC-Post: 2.96 L
FVC-Pre: 3.02 L
Post FEV1/FVC ratio: 69 %
Post FEV6/FVC ratio: 100 %
Pre FEV1/FVC ratio: 67 %
Pre FEV6/FVC Ratio: 97 %

## 2021-03-25 NOTE — Progress Notes (Signed)
PFT done today. 

## 2021-03-27 ENCOUNTER — Other Ambulatory Visit: Payer: Self-pay

## 2021-03-27 ENCOUNTER — Encounter (HOSPITAL_COMMUNITY)
Admission: RE | Admit: 2021-03-27 | Discharge: 2021-03-27 | Disposition: A | Discharge status: Home / Self Care | Payer: Medicare Other | Source: Ambulatory Visit | Attending: Acute Care | Admitting: Acute Care

## 2021-03-27 DIAGNOSIS — R911 Solitary pulmonary nodule: Secondary | ICD-10-CM | POA: Diagnosis not present

## 2021-03-27 MED ORDER — FLUDEOXYGLUCOSE F - 18 (FDG) INJECTION
5.4160 | Freq: Once | INTRAVENOUS | Status: AC | PRN
  Administered 2021-03-27: 5.416 via INTRAVENOUS

## 2021-04-03 ENCOUNTER — Encounter (HOSPITAL_COMMUNITY): Admission: RE | Admit: 2021-04-03 | Payer: PRIVATE HEALTH INSURANCE | Source: Ambulatory Visit

## 2021-04-03 NOTE — Progress Notes (Signed)
Please let patient know there was resolution of the area of concern on her PET scan. It looks like a resolved infection or inflammatory  process. This is great news. She can return to the screening program in 12 months from the PET scan date. Please sent copy to PCP and place order for annual screening scan. Thanks so much.

## 2021-04-08 ENCOUNTER — Institutional Professional Consult (permissible substitution) (INDEPENDENT_AMBULATORY_CARE_PROVIDER_SITE_OTHER): Payer: Medicare Other | Admitting: Emergency Medicine

## 2021-04-08 ENCOUNTER — Other Ambulatory Visit: Payer: Self-pay

## 2021-07-22 ENCOUNTER — Ambulatory Visit
Admission: RE | Admit: 2021-07-22 | Discharge: 2021-07-22 | Disposition: A | Discharge status: Home / Self Care | Payer: Medicare Other | Source: Ambulatory Visit | Attending: Family Medicine | Admitting: Family Medicine

## 2021-07-22 DIAGNOSIS — Z780 Asymptomatic menopausal state: Secondary | ICD-10-CM

## 2021-07-22 DIAGNOSIS — Z0000 Encounter for general adult medical examination without abnormal findings: Secondary | ICD-10-CM

## 2021-07-22 DIAGNOSIS — Z1231 Encounter for screening mammogram for malignant neoplasm of breast: Secondary | ICD-10-CM

## 2021-07-24 ENCOUNTER — Telehealth: Payer: Self-pay

## 2021-07-24 MED ORDER — ALENDRONATE SODIUM 70 MG PO TABS: 70.0000 mg | ORAL_TABLET | ORAL | 3 refills | Status: DC

## 2021-07-24 NOTE — Telephone Encounter (Signed)
Left message for patient to call back regarding results and recommendations. °

## 2021-07-24 NOTE — Telephone Encounter (Signed)
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  Recommend trying Fosamax 70 mg weekly in addition to 1200 mg a day of calcium and 1000 units a day of vitamin D.

## 2021-07-24 NOTE — Addendum Note (Signed)
Addended by: Grier Rocher on: 07/24/2021 10:22 AM   Modules accepted: Orders

## 2021-07-24 NOTE — Addendum Note (Signed)
Addended by: Grier Rocher on: 07/24/2021 10:34 AM   Modules accepted: Orders

## 2021-07-24 NOTE — Telephone Encounter (Signed)
Patient aware of results. Fosamax sent to preferred pharmacy.

## 2021-07-29 ENCOUNTER — Other Ambulatory Visit: Payer: Self-pay

## 2021-07-29 MED ORDER — ALENDRONATE SODIUM 70 MG PO TABS: 70.0000 mg | ORAL_TABLET | ORAL | 3 refills | Status: AC

## 2021-12-15 IMAGING — CT CT CHEST LUNG CANCER SCREENING LOW DOSE W/O CM
2 of 5 series · 15 of 40 positions shown, 18 images · non-contrast
Comparison: None.

CLINICAL DATA: 65-year-old female with 51 pack-year history of
smoking. Lung cancer screening.

EXAM:
CT CHEST WITHOUT CONTRAST LOW-DOSE FOR LUNG CANCER SCREENING
TECHNIQUE: Multidetector CT imaging of the chest was performed following the
standard protocol without IV contrast.

[Series 4: lung 1.00 br44 cor · coronal · 0.56mm/px · 3 of 286 slices shown]
[im 58/286  lung]
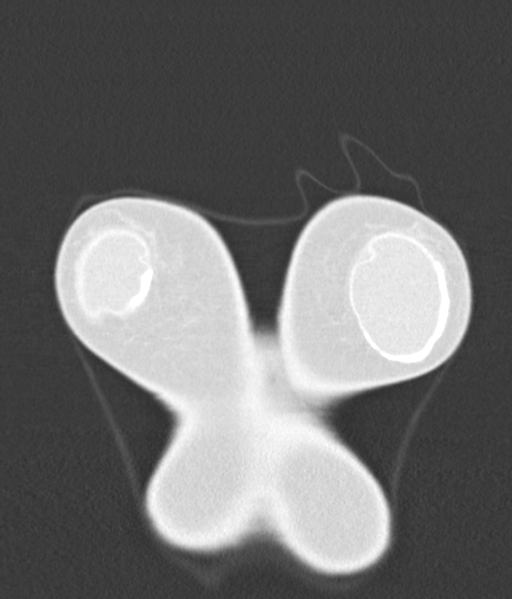
[im 115/286  lung]
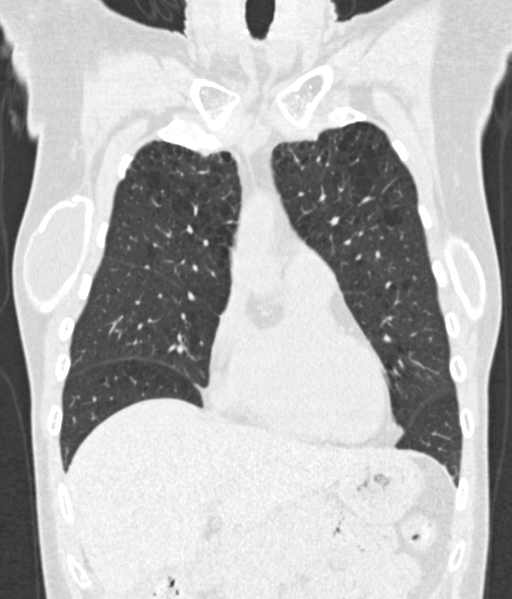
[im 172/286  lung]
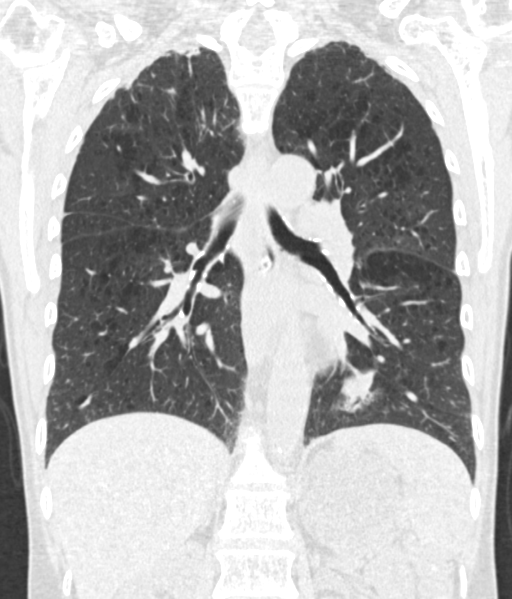

[Series 9: lung 1.00 br60 axial · axial · 0.56mm/px · z∈[-1271,-968]mm · 12 of 335 slices shown, 15 images]
[im 16/335  mediastinal]
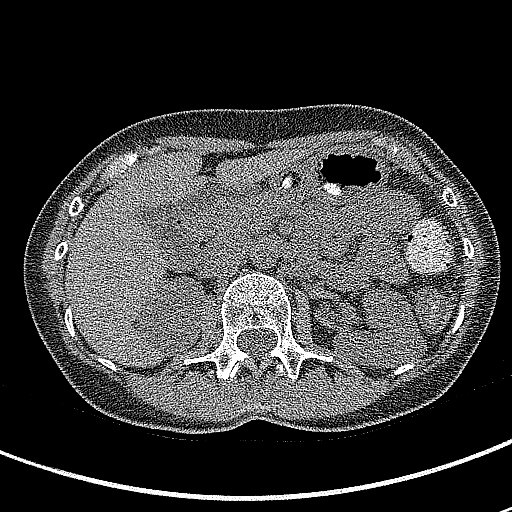
[im 16/335  lung]
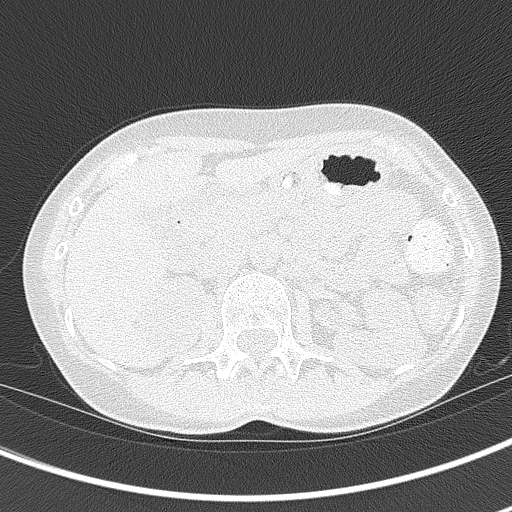
[im 46/335  lung]
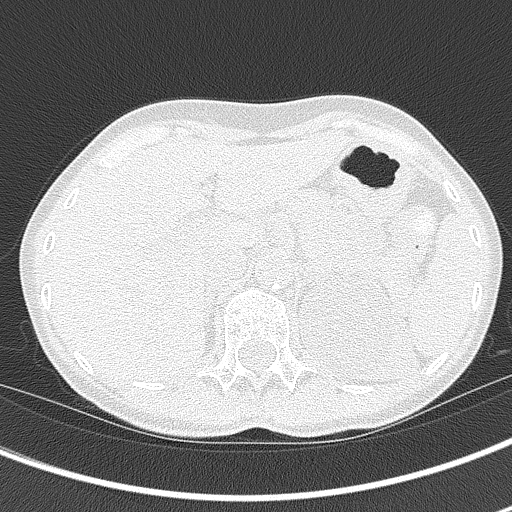
[im 76/335  lung]
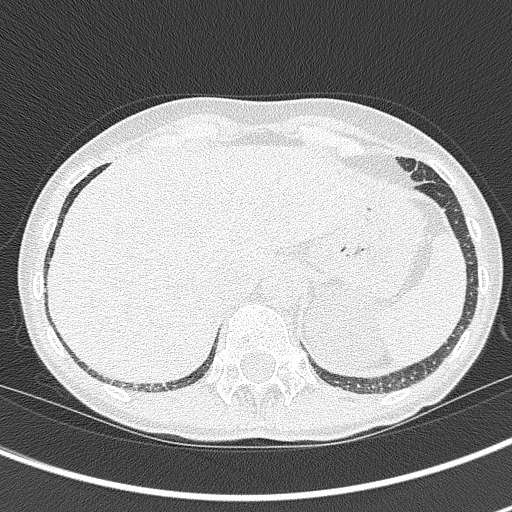
[im 107/335  lung]
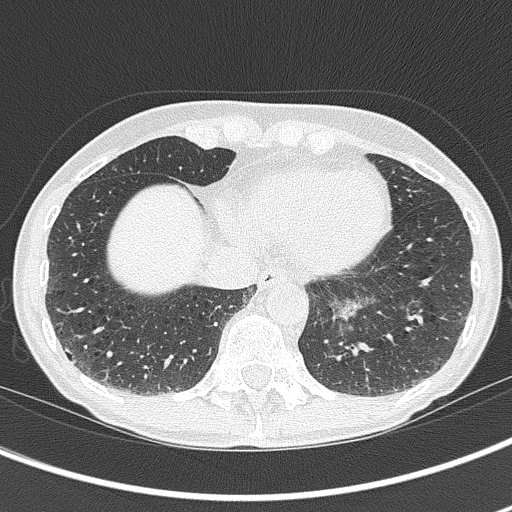
[im 122/335  mediastinal]
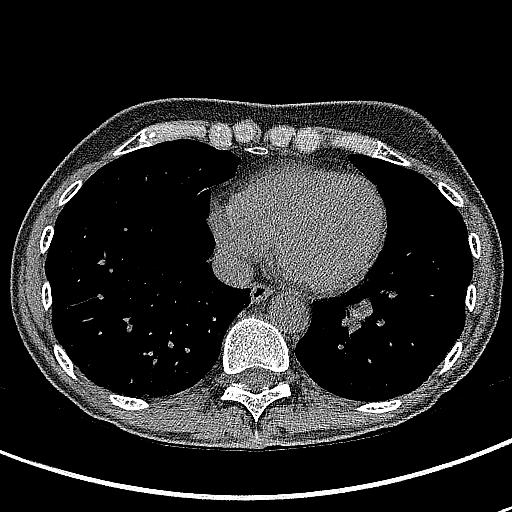
[im 122/335  lung]
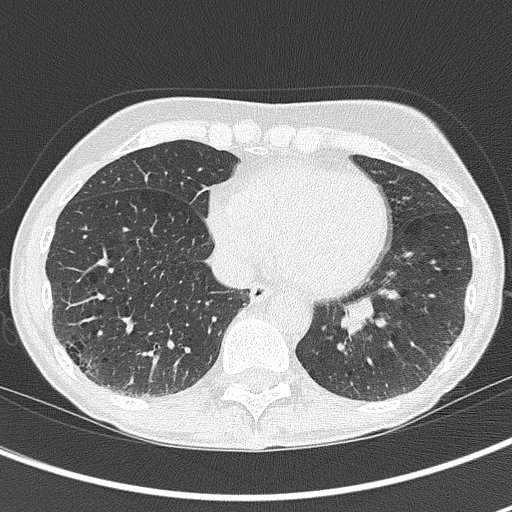
[im 152/335  lung]
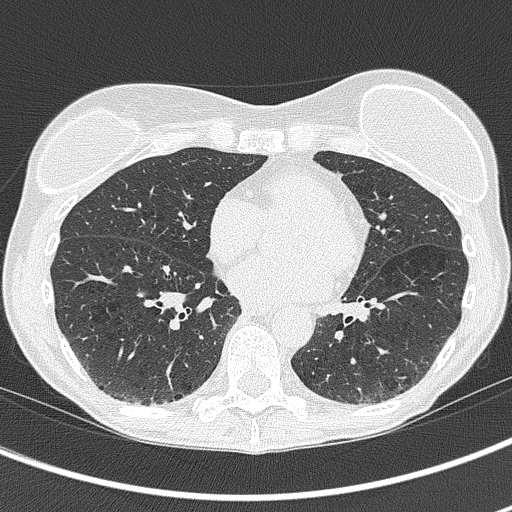
[im 183/335  lung]
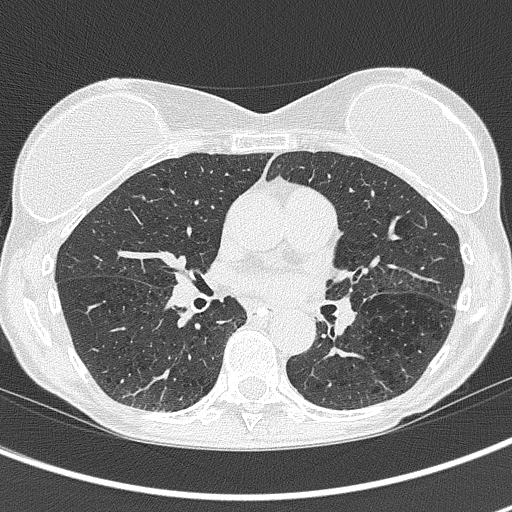
[im 213/335  lung]
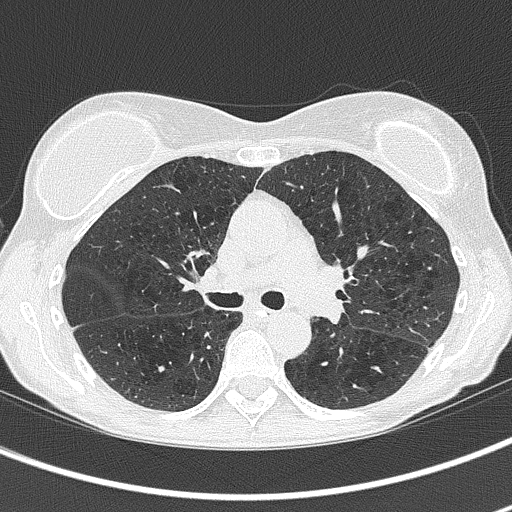
[im 228/335  mediastinal]
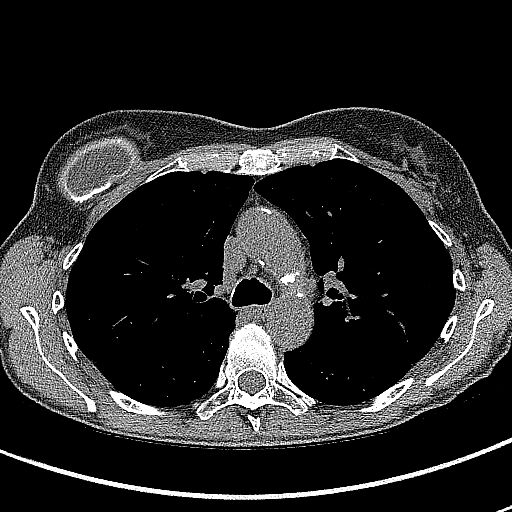
[im 228/335  lung]
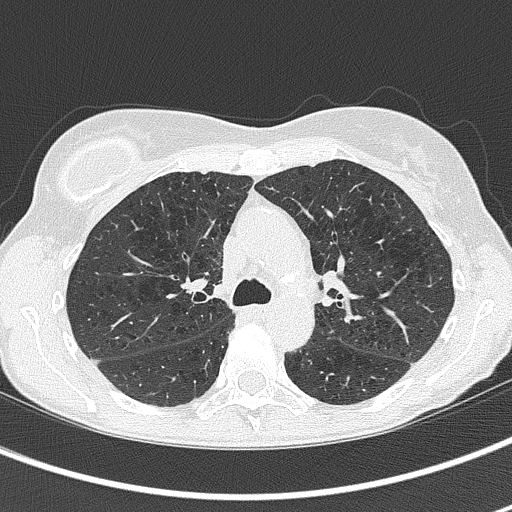
[im 259/335  lung]
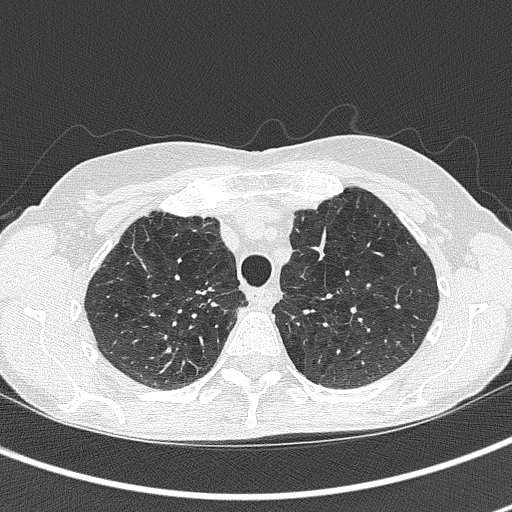
[im 289/335  lung]
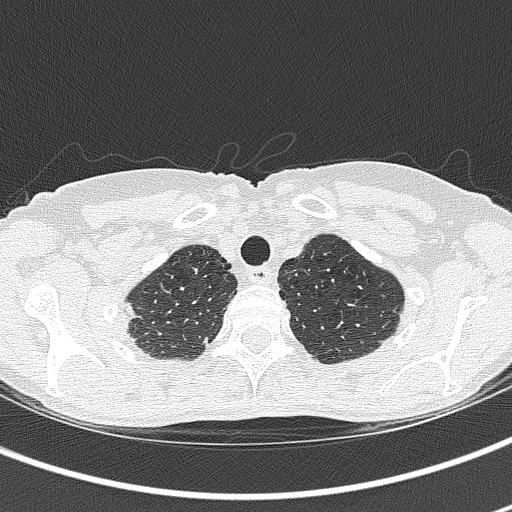
[im 319/335  lung]
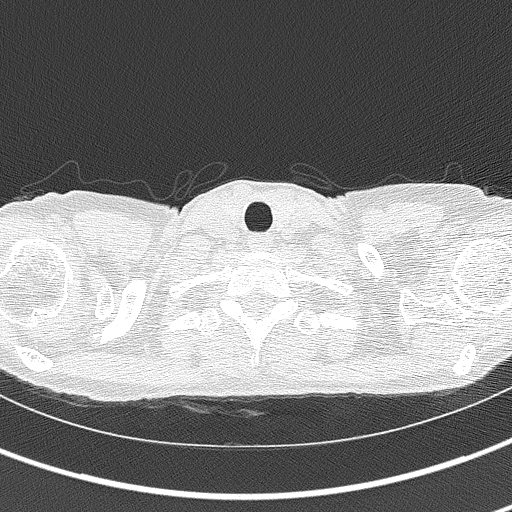

[15 of 40 positions shown; findings below may reference images not displayed]

FINDINGS: Cardiovascular: The heart size is normal. No substantial pericardial
effusion. Coronary artery calcification is evident. Mild
atherosclerotic calcification is noted in the wall of the thoracic
aorta.

Mediastinum/Nodes: No mediastinal lymphadenopathy. No evidence for
gross hilar lymphadenopathy although assessment is limited by the
lack of intravenous contrast on today's study. The esophagus has
normal imaging features. There is no axillary lymphadenopathy.

Lungs/Pleura: Centrilobular and paraseptal emphysema evident.
Biapical pleuroparenchymal scarring evident. Dominant lesion
identified in the infrahilar left lower lobe measuring volume
derived equivalent diameter of 18.4 mm. No other suspicious nodule
or mass. No focal airspace consolidation. No pleural effusion.

Upper Abdomen: 6.4 cm homogeneous low-density lesion in the upper
pole left kidney approaches water attenuation, compatible with a
cyst.

Musculoskeletal: No worrisome lytic or sclerotic osseous
abnormality.
IMPRESSION: 1. Lung-RADS 4B, suspicious. Additional imaging evaluation or
consultation with Pulmonology or Thoracic Surgery recommended.
2. 18 mm irregular infrahilar left lower lobe pulmonary nodule. This
may well be infectious/inflammatory, but neoplasm could certainly
have this appearance. No associated pleural effusion.
3.  Emphysema (VWHRU-EB5.B) and Aortic Atherosclerosis (VWHRU-170.0)

These results will be called to the ordering clinician or
representative by the Radiologist Assistant, and communication
documented in the PACS or [REDACTED].

## 2023-05-11 ENCOUNTER — Other Ambulatory Visit: Payer: Self-pay | Admitting: Family Medicine

## 2023-05-11 DIAGNOSIS — Z1231 Encounter for screening mammogram for malignant neoplasm of breast: Secondary | ICD-10-CM

## 2023-06-01 ENCOUNTER — Ambulatory Visit
Admission: RE | Admit: 2023-06-01 | Discharge: 2023-06-01 | Disposition: A | Discharge status: Home / Self Care | Payer: Medicare Other | Source: Ambulatory Visit | Attending: Family Medicine | Admitting: Family Medicine

## 2023-06-01 DIAGNOSIS — Z1231 Encounter for screening mammogram for malignant neoplasm of breast: Secondary | ICD-10-CM

## 2023-08-13 ENCOUNTER — Encounter: Payer: Self-pay | Admitting: Acute Care

## 2023-09-02 ENCOUNTER — Other Ambulatory Visit: Payer: Self-pay

## 2023-09-02 DIAGNOSIS — Z87891 Personal history of nicotine dependence: Secondary | ICD-10-CM

## 2023-09-02 DIAGNOSIS — Z122 Encounter for screening for malignant neoplasm of respiratory organs: Secondary | ICD-10-CM

## 2023-09-08 ENCOUNTER — Ambulatory Visit
Admission: RE | Admit: 2023-09-08 | Discharge: 2023-09-08 | Disposition: A | Discharge status: Home / Self Care | Payer: Medicare Other | Source: Ambulatory Visit | Attending: Acute Care | Admitting: Acute Care

## 2023-09-08 ENCOUNTER — Other Ambulatory Visit: Payer: Medicare Other

## 2023-09-08 DIAGNOSIS — Z87891 Personal history of nicotine dependence: Secondary | ICD-10-CM

## 2023-09-08 DIAGNOSIS — Z122 Encounter for screening for malignant neoplasm of respiratory organs: Secondary | ICD-10-CM

## 2023-09-21 ENCOUNTER — Other Ambulatory Visit: Payer: Self-pay

## 2023-09-21 DIAGNOSIS — Z122 Encounter for screening for malignant neoplasm of respiratory organs: Secondary | ICD-10-CM

## 2023-09-21 DIAGNOSIS — Z87891 Personal history of nicotine dependence: Secondary | ICD-10-CM

## 2024-06-01 ENCOUNTER — Ambulatory Visit
